# Patient Record
Sex: Female | Born: 1950 | Race: Black or African American | Hispanic: No | Marital: Single | Smoking: Never smoker
Health system: Southern US, Community
[De-identification: ages and names within clinical notes are randomized; demographics above are authoritative.]

## PROBLEM LIST (undated history)

## (undated) DIAGNOSIS — M109 Gout, unspecified: Secondary | ICD-10-CM

## (undated) DIAGNOSIS — I639 Cerebral infarction, unspecified: Secondary | ICD-10-CM

## (undated) DIAGNOSIS — I10 Essential (primary) hypertension: Secondary | ICD-10-CM

---

## 2015-06-08 ENCOUNTER — Emergency Department (HOSPITAL_COMMUNITY)
Admission: EM | Admit: 2015-06-08 | Discharge: 2015-06-08 | Disposition: A | Discharge status: Home / Self Care | Payer: Self-pay | Attending: Emergency Medicine | Admitting: Emergency Medicine

## 2015-06-08 ENCOUNTER — Encounter (HOSPITAL_COMMUNITY): Payer: Self-pay | Admitting: *Deleted

## 2015-06-08 ENCOUNTER — Emergency Department (HOSPITAL_COMMUNITY): Payer: Self-pay

## 2015-06-08 DIAGNOSIS — Z8673 Personal history of transient ischemic attack (TIA), and cerebral infarction without residual deficits: Secondary | ICD-10-CM | POA: Insufficient documentation

## 2015-06-08 DIAGNOSIS — I10 Essential (primary) hypertension: Secondary | ICD-10-CM | POA: Insufficient documentation

## 2015-06-08 DIAGNOSIS — Z880 Allergy status to penicillin: Secondary | ICD-10-CM | POA: Insufficient documentation

## 2015-06-08 DIAGNOSIS — R4781 Slurred speech: Secondary | ICD-10-CM | POA: Insufficient documentation

## 2015-06-08 DIAGNOSIS — R252 Cramp and spasm: Secondary | ICD-10-CM | POA: Insufficient documentation

## 2015-06-08 DIAGNOSIS — R200 Anesthesia of skin: Secondary | ICD-10-CM | POA: Insufficient documentation

## 2015-06-08 DIAGNOSIS — Z8739 Personal history of other diseases of the musculoskeletal system and connective tissue: Secondary | ICD-10-CM | POA: Insufficient documentation

## 2015-06-08 HISTORY — DX: Cerebral infarction, unspecified: I63.9

## 2015-06-08 HISTORY — DX: Essential (primary) hypertension: I10

## 2015-06-08 HISTORY — DX: Gout, unspecified: M10.9

## 2015-06-08 LAB — CBC
HEMATOCRIT: 37.3 % (ref 36.0–46.0)
Hemoglobin: 12.2 g/dL (ref 12.0–15.0)
MCH: 29 pg (ref 26.0–34.0)
MCHC: 32.7 g/dL (ref 30.0–36.0)
MCV: 88.8 fL (ref 78.0–100.0)
Platelets: 296 10*3/uL (ref 150–400)
RBC: 4.2 MIL/uL (ref 3.87–5.11)
RDW: 15.6 % — AB (ref 11.5–15.5)
WBC: 10.9 10*3/uL — ABNORMAL HIGH (ref 4.0–10.5)

## 2015-06-08 LAB — I-STAT CHEM 8, ED
BUN: 20 mg/dL (ref 6–20)
CREATININE: 1.5 mg/dL — AB (ref 0.44–1.00)
Calcium, Ion: 1.15 mmol/L (ref 1.13–1.30)
Chloride: 104 mmol/L (ref 101–111)
Glucose, Bld: 92 mg/dL (ref 65–99)
HEMATOCRIT: 41 % (ref 36.0–46.0)
HEMOGLOBIN: 13.9 g/dL (ref 12.0–15.0)
Potassium: 4.1 mmol/L (ref 3.5–5.1)
Sodium: 139 mmol/L (ref 135–145)
TCO2: 25 mmol/L (ref 0–100)

## 2015-06-08 LAB — COMPREHENSIVE METABOLIC PANEL
ALT: 12 U/L — AB (ref 14–54)
AST: 20 U/L (ref 15–41)
Albumin: 4.3 g/dL (ref 3.5–5.0)
Alkaline Phosphatase: 63 U/L (ref 38–126)
Anion gap: 11 (ref 5–15)
BUN: 18 mg/dL (ref 6–20)
CHLORIDE: 105 mmol/L (ref 101–111)
CO2: 23 mmol/L (ref 22–32)
CREATININE: 1.69 mg/dL — AB (ref 0.44–1.00)
Calcium: 9.7 mg/dL (ref 8.9–10.3)
GFR, EST AFRICAN AMERICAN: 36 mL/min — AB (ref >60)
GFR, EST NON AFRICAN AMERICAN: 31 mL/min — AB (ref >60)
Glucose, Bld: 98 mg/dL (ref 65–99)
POTASSIUM: 4.1 mmol/L (ref 3.5–5.1)
Sodium: 139 mmol/L (ref 135–145)
Total Bilirubin: 1 mg/dL (ref 0.3–1.2)
Total Protein: 7.8 g/dL (ref 6.5–8.1)

## 2015-06-08 LAB — DIFFERENTIAL
BASOS PCT: 0 %
Basophils Absolute: 0 10*3/uL (ref 0.0–0.1)
EOS ABS: 0.6 10*3/uL (ref 0.0–0.7)
EOS PCT: 5 %
Lymphocytes Relative: 30 %
Lymphs Abs: 3.3 10*3/uL (ref 0.7–4.0)
MONO ABS: 0.7 10*3/uL (ref 0.1–1.0)
MONOS PCT: 7 %
Neutro Abs: 6.3 10*3/uL (ref 1.7–7.7)
Neutrophils Relative %: 58 %

## 2015-06-08 LAB — APTT: aPTT: 30 seconds (ref 24–37)

## 2015-06-08 LAB — I-STAT TROPONIN, ED: TROPONIN I, POC: 0.01 ng/mL (ref 0.00–0.08)

## 2015-06-08 LAB — PROTIME-INR
INR: 1.01 (ref 0.00–1.49)
Prothrombin Time: 13.5 seconds (ref 11.6–15.2)

## 2015-06-08 LAB — CBG MONITORING, ED: Glucose-Capillary: 73 mg/dL (ref 65–99)

## 2015-06-08 LAB — CK: Total CK: 275 U/L — ABNORMAL HIGH (ref 38–234)

## 2015-06-08 MED ORDER — ACETAMINOPHEN 325 MG PO TABS
650.0000 mg | ORAL_TABLET | Freq: Once | ORAL | Status: AC
  Administered 2015-06-08: 650 mg via ORAL
  Filled 2015-06-08: qty 2

## 2015-06-08 NOTE — ED Notes (Signed)
Patient transported to MRI 

## 2015-06-08 NOTE — ED Notes (Signed)
Ambulated with assistance/walker-no difficulties

## 2015-06-08 NOTE — ED Provider Notes (Addendum)
CSN: [id number]     Arrival date & time 06/08/15  1201 History   First MD Initiated Contact with Patient 06/08/15 1220     Chief Complaint  Patient presents with  . Weakness     (Consider location/radiation/quality/duration/timing/severity/associated sxs/prior Treatment) HPI Complains of muscle cramps in both arms for the past 2 days and muscle cramps in both feet for the past 2 days. She also reports slurred speech and numbness in her entire right hand for 2 days. She is concerned that she might be having a stroke. Symptoms gradual onset. Constant. No other associated symptoms. Nothing makes symptoms better or worse. No treatment prior to coming here. Denies weakness. She states her hands cramp, making it difficult to grasp objects. Denies abdominal pain Past Medical History  Diagnosis Date  . Hypertension   . Gout   . Stroke [facility])    History reviewed. No pertinent past surgical history. No family history on file. Social History  Substance Use Topics  . Smoking status: Never Smoker   . Smokeless tobacco: None  . Alcohol Use: No   OB History    No data available     Review of Systems  Constitutional: Negative.   HENT: Negative.   Respiratory: Negative.   Cardiovascular: Negative.   Gastrointestinal: Negative.   Musculoskeletal: Positive for myalgias and gait problem.       Walks with walker  Skin: Negative.   Neurological: Positive for speech difficulty.  Psychiatric/Behavioral: Negative.   All other systems reviewed and are negative.     Allergies  Penicillins  Home Medications   Prior to Admission medications   Not on File   BP 117/99 mmHg  Pulse 80  Temp(Src) 98.3 F (36.8 C) (Oral)  Resp 18  Ht  (1.676 m)  Wt 130 lb (58.968 kg)  BMI 20.99 kg/m2  SpO2 100% Physical Exam  Constitutional: She is oriented to person, place, and time. She appears well-developed and well-nourished.  HENT:  Head: Normocephalic and atraumatic.  Eyes: Conjunctivae are  normal. Pupils are equal, round, and reactive to light.  Neck: Neck supple. No tracheal deviation present. No thyromegaly present.  Cardiovascular: Normal rate and regular rhythm.   No murmur heard. Pulmonary/Chest: Effort normal and breath sounds normal.  Abdominal: Soft. Bowel sounds are normal. She exhibits no distension. There is no tenderness.  Musculoskeletal: Normal range of motion. She exhibits no edema or tenderness.  Neurological: She is alert and oriented to person, place, and time. No cranial nerve deficit. Coordination normal.  Speech is clear. Moves all extremities. Motor strength 5 over 5 overall DTRs symmetric bilaterally at knee jerk and ankle jerk and biceps toes downward going bilaterally pronator drift normal finger to nose normal  Skin: Skin is warm and dry. No rash noted.  Psychiatric: She has a normal mood and affect.  Nursing note and vitals reviewed.   ED Course  Procedures (including critical care time) Labs Review Labs Reviewed  I-STAT CHEM 8, ED - Abnormal; Notable for the following:    Creatinine, Ser 1.50 (*)    All other components within normal limits  PROTIME-INR  APTT  CBC  DIFFERENTIAL  COMPREHENSIVE METABOLIC PANEL  CK  I-STAT TROPOININ, ED  CBG MONITORING, ED    Imaging Review No results found. I have personally reviewed and evaluated these images and lab results as part of my medical decision-making.   EKG Interpretation   Date/Time:  Friday June 08 2015 12:43:33 EST Ventricular Rate:  70 PR  Interval:  111 QRS Duration: 85 QT Interval:  393 QTC Calculation: 424 R Axis:   15 Text Interpretation:  Sinus rhythm Atrial premature complexes Borderline  short PR interval No old tracing to compare Confirmed by Ethelda Chick  MD,  Cas Tracz [phone]) on 06/08/2015 12:57:46 PM     3:30 PM patient is able to ambulate with her walker without difficulty. She states she is hungry. She'll be given a meal prior to discharge. Patient passed swallow  screen Results for orders placed or performed during the hospital encounter of 06/08/15  Protime-INR  Result Value Ref Range   Prothrombin Time 13.5 11.6 - 15.2 seconds   INR 1.01 0.00 - 1.49  APTT  Result Value Ref Range   aPTT 30 24 - 37 seconds  CBC  Result Value Ref Range   WBC 10.9 (H) 4.0 - 10.5 K/uL   RBC 4.20 3.87 - 5.11 MIL/uL   Hemoglobin 12.2 12.0 - 15.0 g/dL   HCT 60.4 54.0 - 98.1 %   MCV 88.8 78.0 - 100.0 fL   MCH 29.0 26.0 - 34.0 pg   MCHC 32.7 30.0 - 36.0 g/dL   RDW 19.1 (H) 47.8 - 29.5 %   Platelets 296 150 - 400 K/uL  Differential  Result Value Ref Range   Neutrophils Relative % 58 %   Neutro Abs 6.3 1.7 - 7.7 K/uL   Lymphocytes Relative 30 %   Lymphs Abs 3.3 0.7 - 4.0 K/uL   Monocytes Relative 7 %   Monocytes Absolute 0.7 0.1 - 1.0 K/uL   Eosinophils Relative 5 %   Eosinophils Absolute 0.6 0.0 - 0.7 K/uL   Basophils Relative 0 %   Basophils Absolute 0.0 0.0 - 0.1 K/uL  Comprehensive metabolic panel  Result Value Ref Range   Sodium 139 135 - 145 mmol/L   Potassium 4.1 3.5 - 5.1 mmol/L   Chloride 105 101 - 111 mmol/L   CO2 23 22 - 32 mmol/L   Glucose, Bld 98 65 - 99 mg/dL   BUN 18 6 - 20 mg/dL   Creatinine, Ser 6.21 (H) 0.44 - 1.00 mg/dL   Calcium 9.7 8.9 - 30.8 mg/dL   Total Protein 7.8 6.5 - 8.1 g/dL   Albumin 4.3 3.5 - 5.0 g/dL   AST 20 15 - 41 U/L   ALT 12 (L) 14 - 54 U/L   Alkaline Phosphatase 63 38 - 126 U/L   Total Bilirubin 1.0 0.3 - 1.2 mg/dL   GFR calc non Af Amer 31 (L) >60 mL/min   GFR calc Af Amer 36 (L) >60 mL/min   Anion gap 11 5 - 15  CK  Result Value Ref Range   Total CK 275 (H) 38 - 234 U/L  I-stat troponin, ED (not at [facility], [facility])  Result Value Ref Range   Troponin i, poc 0.01 0.00 - 0.08 ng/mL   Comment 3          CBG monitoring, ED  Result Value Ref Range   Glucose-Capillary 73 65 - 99 mg/dL  I-Stat Chem 8, ED  (not at [facility], [facility])  Result Value Ref Range   Sodium 139 135 - 145 mmol/L   Potassium 4.1 3.5 - 5.1 mmol/L    Chloride 104 101 - 111 mmol/L   BUN 20 6 - 20 mg/dL   Creatinine, Ser 6.57 (H) 0.44 - 1.00 mg/dL   Glucose, Bld 92 65 - 99 mg/dL   Calcium, Ion 8.46 9.62 - 1.30 mmol/L  TCO2 25 0 - 100 mmol/L   Hemoglobin 13.9 12.0 - 15.0 g/dL   HCT 86.5 78.4 - 69.6 %   Mr Brain Wo Contrast  06/08/2015  CLINICAL DATA:  Slurred speech.  Right arm weakness and numbness. EXAM: MRI HEAD WITHOUT CONTRAST TECHNIQUE: Multiplanar, multiecho pulse sequences of the brain and surrounding structures were obtained without intravenous contrast. COMPARISON:  None. FINDINGS: Negative for acute infarct. Moderate chronic microvascular ischemic changes in the white matter and pons. Small chronic infarct right medial thalamus. Mild chronic ischemia in the basal ganglia bilaterally. Mild generalized atrophy.  Negative for hydrocephalus. Chronic micro hemorrhage right posterior thalamus. No fluid collection Negative for mass or edema.  No shift of the midline structures Right frontal scalp hematoma measuring approximately 5 x 20 mm. Paranasal sinuses clear.  Normal orbit bilaterally. IMPRESSION: Atrophy and chronic microvascular ischemia. Negative for acute infarct. Electronically Signed   By: Marlan Palau M.D.   On: 06/08/2015 14:02    MDM  No evidence of stroke. Patient complains of muscle cramps and myalgias. There may be element of neuropathy. She reports prior "kidney problems" therefore was recommended Tylenol for aches. She'll be referred to [facility] to get primary care physician Final diagnoses:  None  Dx #1 myalgias (muscle cramps) #2 renal insufficiency #3elevated blood pressure      Doug Sou, MD 06/08/15 1537  Doug Sou, MD 06/08/15 1540

## 2015-06-08 NOTE — Discharge Instructions (Signed)
Muscle Cramps and Spasms Take Tylenol as directed for pain. Call the [facility] today or on Monday, 06/11/2015 to arrange to get a primary care physician. No evidence of a stroke. Your kidney function is mildly abnormal. Blood creatinine level is 1.69. This can be followed by her primary care physician and should be rechecked in several months. Your blood pressure should be rechecked within the next 3 weeks. Today's is mildly elevated at 168/88. Muscle cramps and spasms occur when a muscle or muscles tighten and you have no control over this tightening (involuntary muscle contraction). They are a common problem and can develop in any muscle. The most common place is in the calf muscles of the leg. Both muscle cramps and muscle spasms are involuntary muscle contractions, but they also have differences:   Muscle cramps are sporadic and painful. They may last a few seconds to a quarter of an hour. Muscle cramps are often more forceful and last longer than muscle spasms.  Muscle spasms may or may not be painful. They may also last just a few seconds or much longer. CAUSES  It is uncommon for cramps or spasms to be due to a serious underlying problem. In many cases, the cause of cramps or spasms is unknown. Some common causes are:   Overexertion.   Overuse from repetitive motions (doing the same thing over and over).   Remaining in a certain position for a long period of time.   Improper preparation, form, or technique while performing a sport or activity.   Dehydration.   Injury.   Side effects of some medicines.   Abnormally low levels of the salts and ions in your blood (electrolytes), especially potassium and calcium. This could happen if you are taking water pills (diuretics) or you are pregnant.  Some underlying medical problems can make it more likely to develop cramps or spasms. These include, but are not limited to:   Diabetes.   Parkinson  disease.   Hormone disorders, such as thyroid problems.   Alcohol abuse.   Diseases specific to muscles, joints, and bones.   Blood vessel disease where not enough blood is getting to the muscles.  HOME CARE INSTRUCTIONS   Stay well hydrated. Drink enough water and fluids to keep your urine clear or pale yellow.  It may be helpful to massage, stretch, and relax the affected muscle.  For tight or tense muscles, use a warm towel, heating pad, or hot shower water directed to the affected area.  If you are sore or have pain after a cramp or spasm, applying ice to the affected area may relieve discomfort.  Put ice in a plastic bag.  Place a towel between your skin and the bag.  Leave the ice on for 15-20 minutes, 03-04 times a day.  Medicines used to treat a known cause of cramps or spasms may help reduce their frequency or severity. Only take over-the-counter or prescription medicines as directed by your caregiver. SEEK MEDICAL CARE IF:  Your cramps or spasms get more severe, more frequent, or do not improve over time.  MAKE SURE YOU:   Understand these instructions.  Will watch your condition.  Will get help right away if you are not doing well or get worse.   This information is not intended to replace advice given to you by your health care provider. Make sure you discuss any questions you have with your health care provider.   Document Released: 10/25/2001 Document Revised:  08/30/2012 Document Reviewed: 04/21/2012 Elsevier Interactive Patient Education [employer]

## 2015-06-08 NOTE — ED Notes (Addendum)
Pt reports R arm weakness and numbness with slurred speech x 2 nights ago.  Pt reports having a stroke years ago.  Pt is A&Ox 4. Denies any other neuro sxs at this time.  Pt also reports mid abd pain, worse when "twisting" the the side.    Denies any n/v at this time.  Pt also reports decreased appetite recently.

## 2016-05-20 ENCOUNTER — Emergency Department (HOSPITAL_COMMUNITY): Payer: Medicaid Other

## 2016-05-20 ENCOUNTER — Encounter (HOSPITAL_COMMUNITY): Payer: Self-pay

## 2016-05-20 ENCOUNTER — Emergency Department (HOSPITAL_COMMUNITY)
Admission: EM | Admit: 2016-05-20 | Discharge: 2016-05-20 | Disposition: A | Discharge status: Home / Self Care | Payer: Medicaid Other | Attending: Emergency Medicine | Admitting: Emergency Medicine

## 2016-05-20 DIAGNOSIS — Z79899 Other long term (current) drug therapy: Secondary | ICD-10-CM | POA: Insufficient documentation

## 2016-05-20 DIAGNOSIS — I10 Essential (primary) hypertension: Secondary | ICD-10-CM | POA: Insufficient documentation

## 2016-05-20 DIAGNOSIS — Z8673 Personal history of transient ischemic attack (TIA), and cerebral infarction without residual deficits: Secondary | ICD-10-CM | POA: Diagnosis not present

## 2016-05-20 DIAGNOSIS — R4701 Aphasia: Secondary | ICD-10-CM

## 2016-05-20 DIAGNOSIS — R4182 Altered mental status, unspecified: Secondary | ICD-10-CM | POA: Diagnosis present

## 2016-05-20 LAB — CBC WITH DIFFERENTIAL/PLATELET
BASOS ABS: 0 10*3/uL (ref 0.0–0.1)
BASOS PCT: 0 %
Eosinophils Absolute: 0.1 10*3/uL (ref 0.0–0.7)
Eosinophils Relative: 1 %
HEMATOCRIT: 28.7 % — AB (ref 36.0–46.0)
Hemoglobin: 9.7 g/dL — ABNORMAL LOW (ref 12.0–15.0)
LYMPHS PCT: 12 %
Lymphs Abs: 1.9 10*3/uL (ref 0.7–4.0)
MCH: 29 pg (ref 26.0–34.0)
MCHC: 33.8 g/dL (ref 30.0–36.0)
MCV: 85.9 fL (ref 78.0–100.0)
Monocytes Absolute: 0.4 10*3/uL (ref 0.1–1.0)
Monocytes Relative: 3 %
NEUTROS ABS: 13.4 10*3/uL — AB (ref 1.7–7.7)
NEUTROS PCT: 84 %
Platelets: 363 10*3/uL (ref 150–400)
RBC: 3.34 MIL/uL — AB (ref 3.87–5.11)
RDW: 15.4 % (ref 11.5–15.5)
WBC: 15.8 10*3/uL — AB (ref 4.0–10.5)

## 2016-05-20 LAB — URINALYSIS, ROUTINE W REFLEX MICROSCOPIC
BILIRUBIN URINE: NEGATIVE
GLUCOSE, UA: NEGATIVE mg/dL
HGB URINE DIPSTICK: NEGATIVE
Ketones, ur: NEGATIVE mg/dL
Leukocytes, UA: NEGATIVE
Nitrite: NEGATIVE
Protein, ur: NEGATIVE mg/dL
SPECIFIC GRAVITY, URINE: 1.014 (ref 1.005–1.030)
pH: 5 (ref 5.0–8.0)

## 2016-05-20 LAB — COMPREHENSIVE METABOLIC PANEL
ALBUMIN: 3.4 g/dL — AB (ref 3.5–5.0)
ALT: 8 U/L — AB (ref 14–54)
AST: 11 U/L — AB (ref 15–41)
Alkaline Phosphatase: 62 U/L (ref 38–126)
Anion gap: 12 (ref 5–15)
BILIRUBIN TOTAL: 1.1 mg/dL (ref 0.3–1.2)
BUN: 30 mg/dL — AB (ref 6–20)
CHLORIDE: 105 mmol/L (ref 101–111)
CO2: 19 mmol/L — ABNORMAL LOW (ref 22–32)
CREATININE: 1.7 mg/dL — AB (ref 0.44–1.00)
Calcium: 9.3 mg/dL (ref 8.9–10.3)
GFR calc Af Amer: 36 mL/min — ABNORMAL LOW (ref >60)
GFR calc non Af Amer: 31 mL/min — ABNORMAL LOW (ref >60)
GLUCOSE: 122 mg/dL — AB (ref 65–99)
Potassium: 4.1 mmol/L (ref 3.5–5.1)
Sodium: 136 mmol/L (ref 135–145)
Total Protein: 6.5 g/dL (ref 6.5–8.1)

## 2016-05-20 LAB — POC OCCULT BLOOD, ED: FECAL OCCULT BLD: NEGATIVE

## 2016-05-20 MED ORDER — ACETAMINOPHEN 500 MG PO TABS
1000.0000 mg | ORAL_TABLET | Freq: Once | ORAL | Status: AC
  Administered 2016-05-20: 500 mg via ORAL
  Filled 2016-05-20: qty 2

## 2016-05-20 MED ORDER — SODIUM CHLORIDE 0.9 % IV BOLUS (SEPSIS)
500.0000 mL | Freq: Once | INTRAVENOUS | Status: AC
  Administered 2016-05-20: 500 mL via INTRAVENOUS

## 2016-05-20 NOTE — ED Notes (Signed)
ED Provider at bedside. 

## 2016-05-20 NOTE — ED Provider Notes (Signed)
.   Please see previous physicians note regarding patient's presenting history and physical, initial ED course, and associated medical decision making. 66 year old female who presents with 10 minutes of decreased responsiveness, now at baseline. No LOC. 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 Awaiting blood work and imaging at time of sign out.   CT head visualized and w/o acute intracranial processes. CXR visualized and w/o infiltrate, edema or other acute cardiopulmonary processes. She does have leukocytosis but no source of infection on x-ray, no UTI on UA, and no skin findings. Abdomen benign. Continues to be at baseline mental status in ED. Does have worsening anemia 9.7 but no GI bleed history and stool guaiac negative. Briefly tachycardic in ED and received 500 cc of fluids with improvement. Prior to discharge, HR 90s on monitor.   Son at bedside states she has been her normal self in ED. Feels comfortable monitoring patient at home. Unclear source of her episode today, but given that she is normal and stable, appropriate for discharge home.  Strict return and follow-up instructions reviewed. He expressed understanding of all discharge instructions and felt comfortable with the plan of care.     Lavera Guiseana Duo Kaetlyn Noa, MD 05/21/16 [phone]

## 2016-05-20 NOTE — Discharge Instructions (Signed)
Your work-up today does not show infection. The scan of the head was baseline.  Please return without fail for worsening symptoms, including fever, confusion, vomiting, difficulty breathing or any other symptoms concerning to you.

## 2016-05-20 NOTE — ED Notes (Signed)
Patient transported to CT 

## 2016-05-20 NOTE — ED Notes (Signed)
Upon walking into the room, pt pulling at the tape around her Iv. On further assessment, IV had been pulled out. Will attempt to insert another line.

## 2016-05-20 NOTE — ED Notes (Signed)
Pt returned from CT °

## 2016-05-20 NOTE — ED Notes (Signed)
Pt daughter called and given update on pt. Daughter left her phone number in the event she needs to be reached. Per pt's daughter, pt's son is on his way back to the hospital.   [place]: [phone].

## 2016-05-20 NOTE — ED Triage Notes (Signed)
PER EMS: pt is from home, family called 911 due to pt sitting on the side of the bed appearing lethargic and did not respond to them today around 1240. They stated this only lasted about 10 minutes. EMS arrived and pt has returned back to her baseline mentation, which is non-verbal but able to comprehend and write down responses. CBG-99. BP-144/112, HR-86. Dr. Particia NearingHaviland came to assess patient on arrival.

## 2016-05-20 NOTE — ED Notes (Signed)
Care handoff to Caitlyn, RN and Tori, RN 

## 2016-05-20 NOTE — ED Notes (Signed)
Pt wheeled to lobby by this nurse. Per son, will have ride come help transport pt home.

## 2016-05-20 NOTE — ED Provider Notes (Signed)
[facility]-EMERGENCY DEPT Provider Note   CSN: [id number] Arrival date & time: 05/20/16  1352     History   Chief Complaint Chief Complaint  Patient presents with  . Altered Mental Status    HPI Alicia Chapman is a 66 y.o. female.  Pt presents to the ED with altered mental status earlier today.  The pt was found sitting on the side of her bed by family.  She did not respond to the family for about 10 minutes.  By the time, EMS arrived, she was back to normal.  Pt is nonverbal normally and is unable to contribute to the history.  EMS said family told him that she was in the hospital about 1 month ago with the slurred speech, but they did not know the reason why pt can't speak.  Pt was in the ED 1 year ago with slurred speech that had resolved by the time of d/c.  She does not have a pcp and has not seen a neurologist.      Past Medical History:  Diagnosis Date  . Gout   . Hypertension   . Stroke [facility])     There are no active problems to display for this patient.   History reviewed. No pertinent surgical history.  OB History    No data available       Home Medications    Prior to Admission medications   Medication Sig Start Date End Date Taking? Authorizing Provider  allopurinol (ZYLOPRIM) 300 MG tablet Take 300 mg by mouth daily.    Historical Provider, MD  cephALEXin (KEFLEX) 500 MG capsule Take 500 mg by mouth daily as needed (infection).    Historical Provider, MD  Cholecalciferol (VITAMIN D) 2000 units tablet Take 4,000 Units by mouth at bedtime.    Historical Provider, MD  colchicine 0.6 MG tablet Take 0.12 mg by mouth daily as needed (gout).    Historical Provider, MD  diclofenac sodium (VOLTAREN) 1 % GEL Apply 2 g topically at bedtime.    Historical Provider, MD  gabapentin (NEURONTIN) 100 MG capsule Take 100 mg by mouth 3 (three) times daily.    Historical Provider, MD  losartan-hydrochlorothiazide (HYZAAR) 50-12.5 MG tablet Take 1 tablet by mouth at bedtime.     Historical Provider, MD  Melatonin 3 MG TABS Take 1 tablet by mouth at bedtime.    Historical Provider, MD  Misc Natural Products (TURMERIC CURCUMIN) CAPS Take 2 capsules by mouth at bedtime.    Historical Provider, MD  naproxen sodium (ANAPROX) 220 MG tablet Take 440 mg by mouth 2 (two) times daily as needed (knee pain).    Historical Provider, MD  oxyCODONE-acetaminophen (PERCOCET/ROXICET) 5-325 MG tablet Take 1 tablet by mouth every 4 (four) hours as needed for severe pain.    Historical Provider, MD  traMADol (ULTRAM) 50 MG tablet Take 50-100 mg by mouth every 6 (six) hours as needed for severe pain.    Historical Provider, MD    Family History No family history on file.  Social History Social History  Substance Use Topics  . Smoking status: Never Smoker  . Smokeless tobacco: Never Used  . Alcohol use No     Allergies   Penicillins   Review of Systems Review of Systems  Neurological: Positive for weakness.  All other systems reviewed and are negative.    Physical Exam Updated Vital Signs BP 124/89   Pulse 104   Temp 97.7 F (36.5 C) (Oral)   Resp 16  SpO2 100%   Physical Exam  Constitutional: She appears well-developed and well-nourished.  HENT:  Head: Normocephalic and atraumatic.  Right Ear: External ear normal.  Left Ear: External ear normal.  Nose: Nose normal.  Mouth/Throat: Oropharynx is clear and moist.  Eyes: Conjunctivae and EOM are normal. Pupils are equal, round, and reactive to light.  Neck: Normal range of motion. Neck supple.  Cardiovascular: Normal rate, regular rhythm, normal heart sounds and intact distal pulses.   Pulmonary/Chest: Effort normal and breath sounds normal.  Abdominal: Soft. Bowel sounds are normal.  Musculoskeletal: Normal range of motion.  Neurological: She is alert.  Aphasia (old)  Moving all 4 extremities and following commands.  Skin: Skin is warm.  Psychiatric: She has a normal mood and affect. Her behavior is  normal. Judgment and thought content normal.  Nursing note and vitals reviewed.    ED Treatments / Results  Labs (all labs ordered are listed, but only abnormal results are displayed) Labs Reviewed  CBC WITH DIFFERENTIAL/PLATELET - Abnormal; Notable for the following:       Result Value   WBC 15.8 (*)    RBC 3.34 (*)    Hemoglobin 9.7 (*)    HCT 28.7 (*)    Neutro Abs 13.4 (*)    All other components within normal limits  COMPREHENSIVE METABOLIC PANEL - Abnormal; Notable for the following:    CO2 19 (*)    Glucose, Bld 122 (*)    BUN 30 (*)    Creatinine, Ser 1.70 (*)    Albumin 3.4 (*)    AST 11 (*)    ALT 8 (*)    GFR calc non Af Amer 31 (*)    GFR calc Af Amer 36 (*)    All other components within normal limits  URINALYSIS, ROUTINE W REFLEX MICROSCOPIC  POC OCCULT BLOOD, ED    EKG  EKG Interpretation  Date/Time:  Tuesday May 20 2016 14:11:49 EST Ventricular Rate:  97 PR Interval:    QRS Duration: 87 QT Interval:  355 QTC Calculation: 451 R Axis:   30 Text Interpretation:  Sinus tachycardia Multiple premature complexes, vent & supraven Sinus pause Short PR interval Abnormal R-wave progression, early transition Borderline repolarization abnormality No significant change since last tracing Confirmed by [facility] MD, Siddhanth Denk ([id number]) on 05/20/2016 2:32:51 PM Also confirmed by [facility] MD, Jawara Latorre [phone]), editor Valentina LucksStout CT, Jola BabinskiMarilyn [phone])  on 05/20/2016 2:35:01 PM       Radiology Dg Chest 2 View  Result Date: 05/20/2016 CLINICAL DATA:  Altered mental status EXAM: CHEST  2 VIEW COMPARISON:  None. FINDINGS: Low lung volumes. Normal heart size. Normal mediastinal contour. No pneumothorax. No pleural effusion. Lungs appear clear, with no acute consolidative airspace disease and no pulmonary edema. IMPRESSION: Hypoventilatory chest radiographs with no active disease. Electronically Signed   By: Delbert PhenixJason A Poff M.D.   On: 05/20/2016 15:03   Ct Head Wo Contrast  Result Date:  05/20/2016 CLINICAL DATA:  Altered mental status. EXAM: CT HEAD WITHOUT CONTRAST TECHNIQUE: Contiguous axial images were obtained from the base of the skull through the vertex without intravenous contrast. COMPARISON:  MRI 06/08/2015 FINDINGS: Brain: There is atrophy and chronic small vessel disease changes. No acute intracranial abnormality. Specifically, no hemorrhage, hydrocephalus, mass lesion, acute infarction, or significant intracranial injury. Vascular: No hyperdense vessel or unexpected calcification. Skull: Visualized paranasal sinuses and mastoids clear. Orbital soft tissues unremarkable. Sinuses/Orbits: Visualized paranasal sinuses and mastoids clear. Orbital soft tissues unremarkable. Other: None IMPRESSION: No acute  intracranial abnormality. Atrophy, chronic microvascular disease. Electronically Signed   By: Charlett Nose M.D.   On: 05/20/2016 15:45    Procedures Procedures (including critical care time)  Medications Ordered in ED Medications  sodium chloride 0.9 % bolus 500 mL (0 mLs Intravenous Stopped 05/20/16 1817)  acetaminophen (TYLENOL) tablet 1,000 mg (500 mg Oral Given 05/20/16 2035)     Initial Impression / Assessment and Plan / ED Course  I have reviewed the triage vital signs and the nursing notes.  Pertinent labs & imaging results that were available during my care of the patient were reviewed by me and considered in my medical decision making (see chart for details).  Clinical Course     Pt has remained asymptomatic while here.   Labs and CT scan pending.  Pt to be signed out to Dr. Verdie Mosher.  Anticipate d/c.  Final Clinical Impressions(s) / ED Diagnoses   Final diagnoses:  Aphasia    New Prescriptions Discharge Medication List as of 05/20/2016  8:11 PM       Jacalyn Lefevre, MD 05/22/16 [phone]

## 2016-11-16 DEATH — deceased

## 2017-09-25 IMAGING — CT CT HEAD W/O CM
3 series · 16 of 47 positions shown, 19 images · non-contrast
Comparison: MRI 06/08/2015

CLINICAL DATA: Altered mental status.

EXAM:
CT HEAD WITHOUT CONTRAST
TECHNIQUE: Contiguous axial images were obtained from the base of the skull
through the vertex without intravenous contrast.

[Series 2: head 5.0 h30s · axial · 0.41mm/px · z∈[-112,+23]mm · 10 of 33 slices shown, 13 images]
[im 3/33  brain]
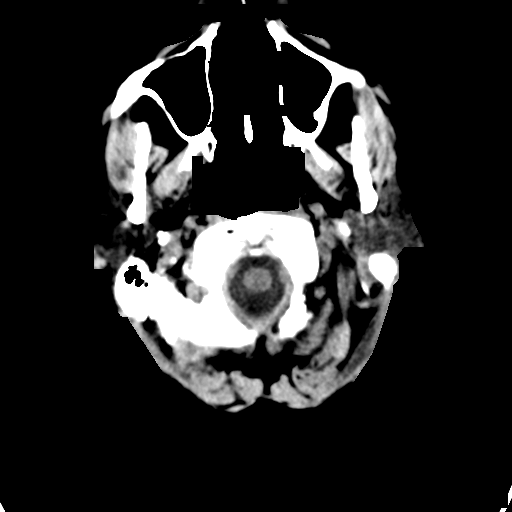
[im 3/33  bone]
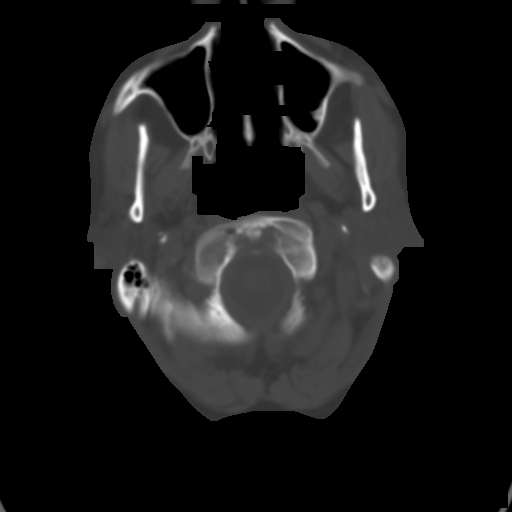
[im 6/33  brain]
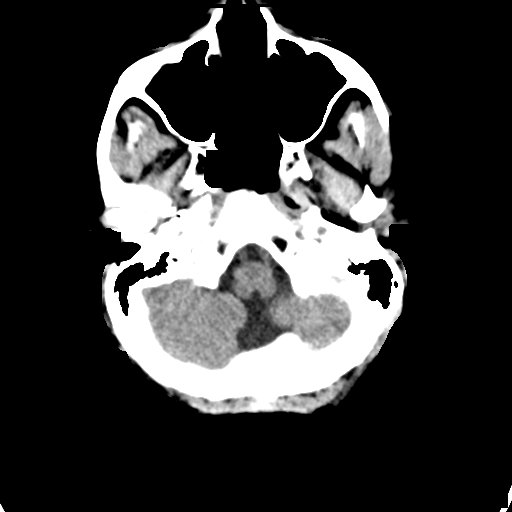
[im 9/33  brain]
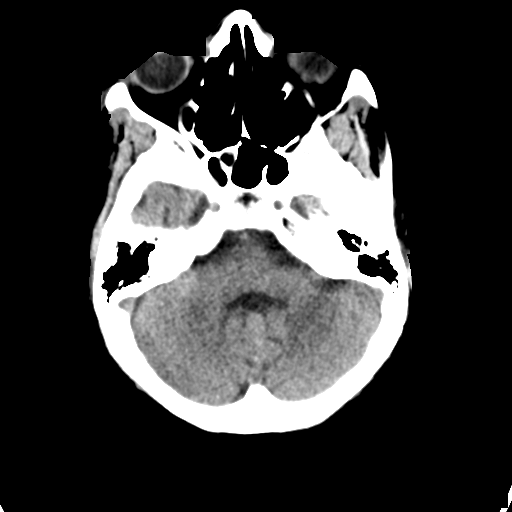
[im 12/33  brain]
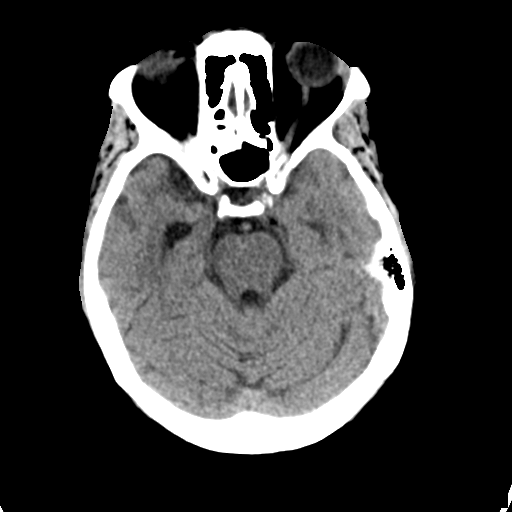
[im 15/33  brain]
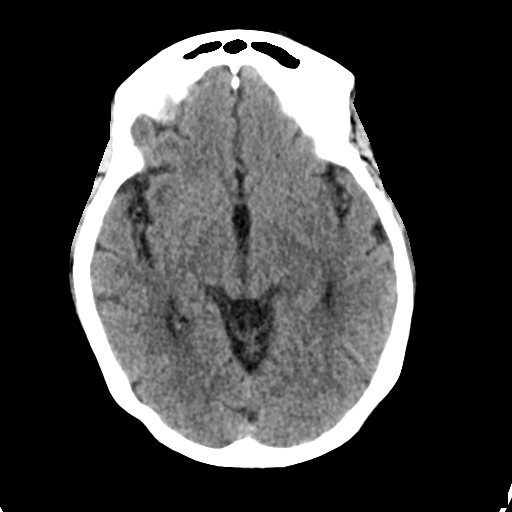
[im 15/33  bone]
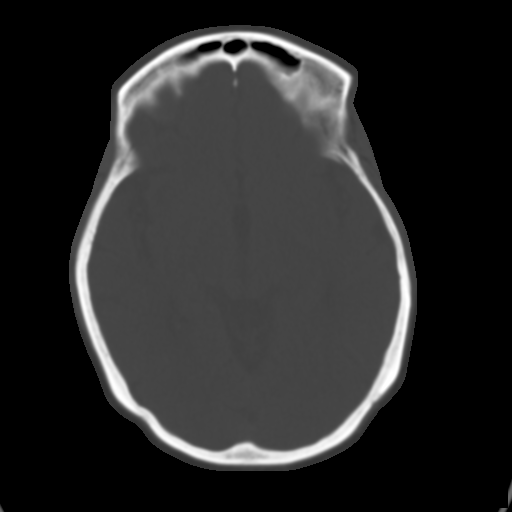
[im 18/33  brain]
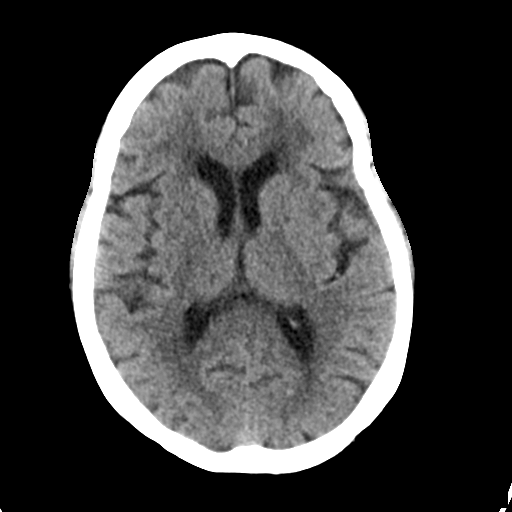
[im 21/33  brain]
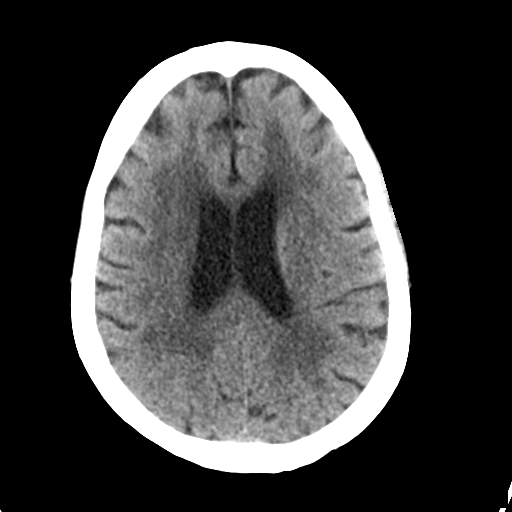
[im 25/33  brain]
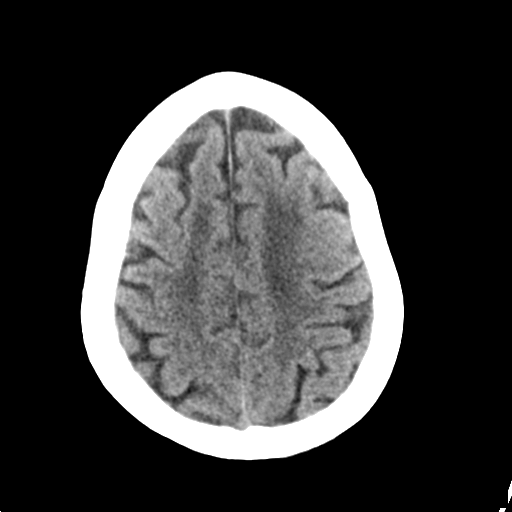
[im 27/33  brain]
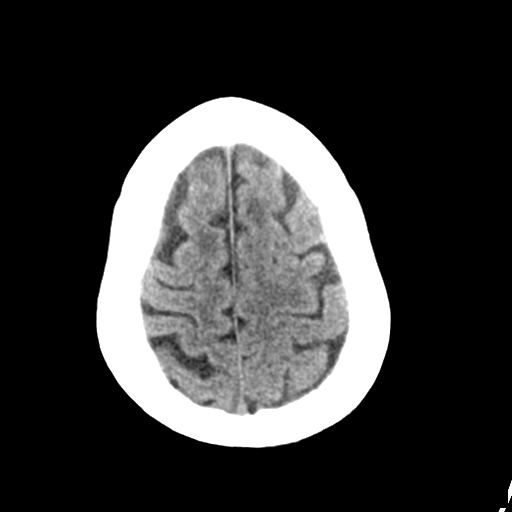
[im 27/33  bone]
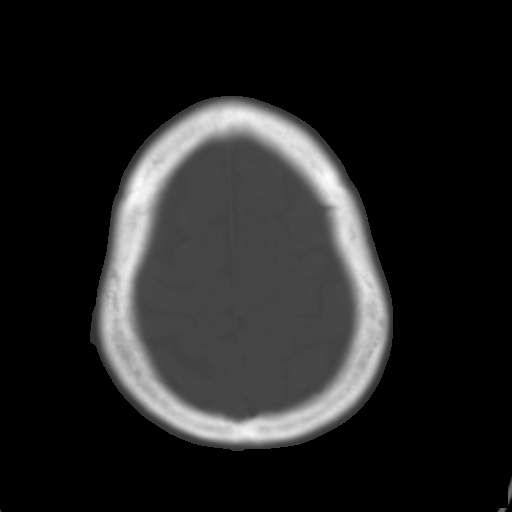
[im 30/33  brain]
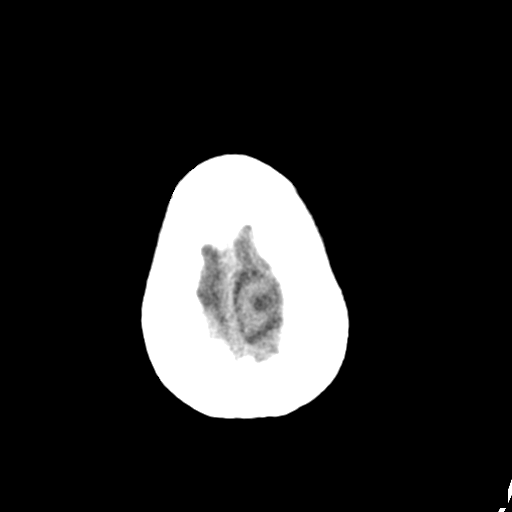

[Series 4: head 3.0 mpr cor · coronal · 0.30mm/px · 3 of 67 slices shown]
[im 23/67  brain]
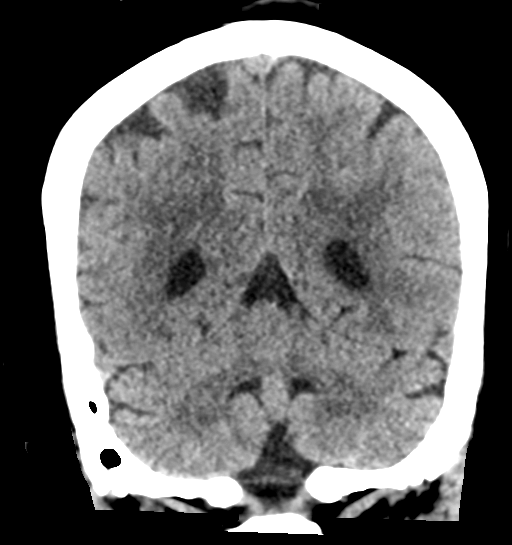
[im 30/67  brain]
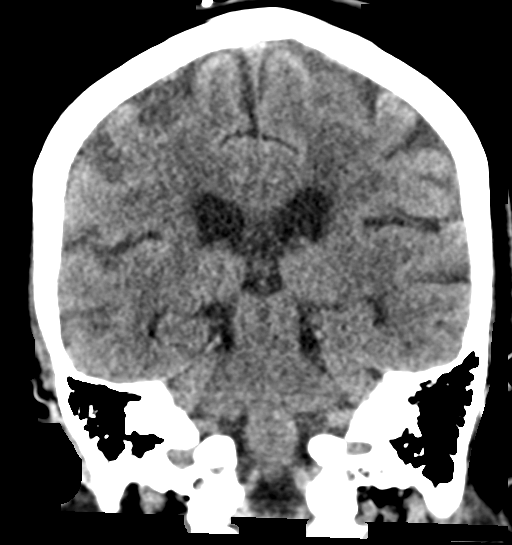
[im 37/67  brain]
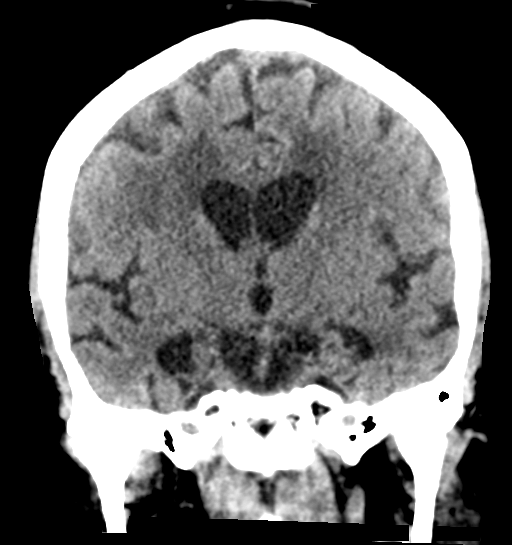

[Series 5: head 3.0 mpr sag · sagittal · 0.32mm/px · 3 of 49 slices shown]
[im 17/49  brain]
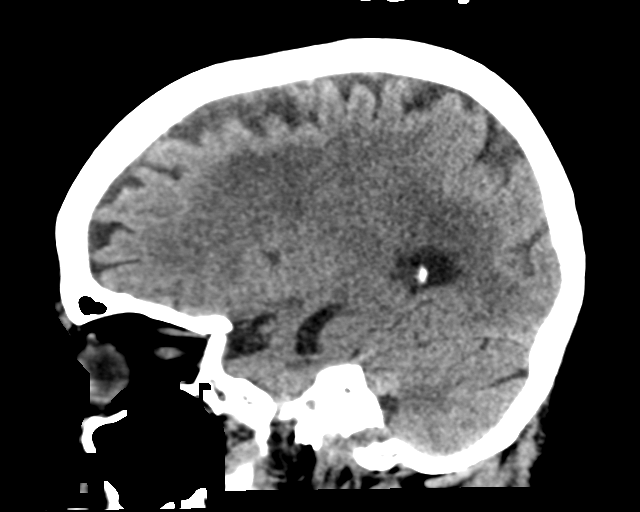
[im 25/49  brain]
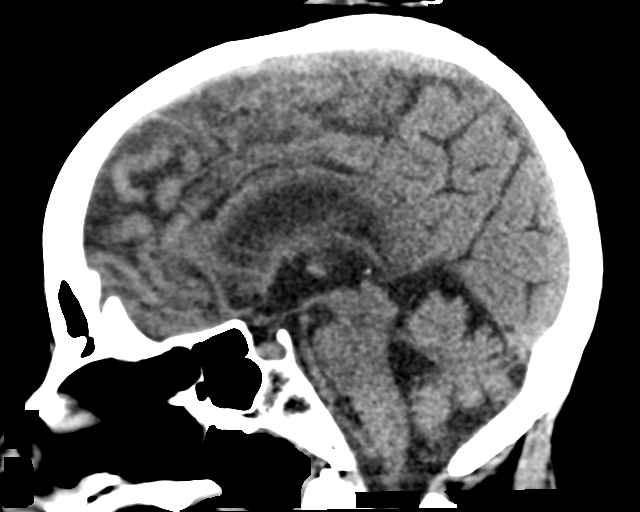
[im 33/49  brain]
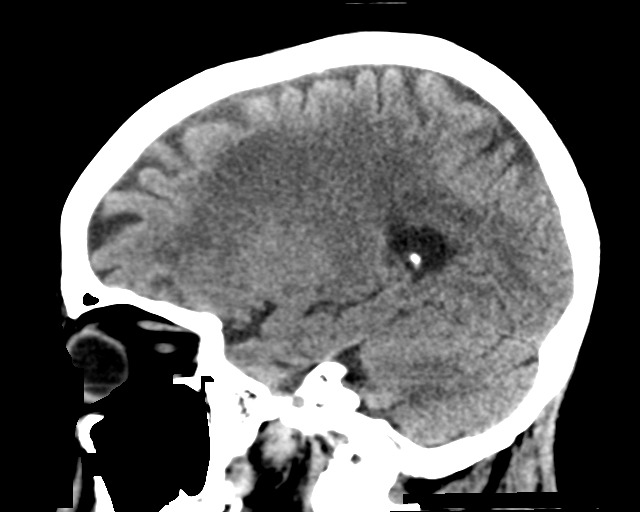

[16 of 47 positions shown; findings below may reference images not displayed]

FINDINGS: Brain: There is atrophy and chronic small vessel disease changes. No
acute intracranial abnormality. Specifically, no hemorrhage,
hydrocephalus, mass lesion, acute infarction, or significant
intracranial injury.

Vascular: No hyperdense vessel or unexpected calcification.

Skull: Visualized paranasal sinuses and mastoids clear. Orbital soft
tissues unremarkable.

Sinuses/Orbits: Visualized paranasal sinuses and mastoids clear.
Orbital soft tissues unremarkable.

Other: None
IMPRESSION: No acute intracranial abnormality.

Atrophy, chronic microvascular disease.
